# Patient Record
Sex: Female | Born: 1965 | Race: Asian | Hispanic: No | Marital: Married | Smoking: Never smoker
Health system: Southern US, Community
[De-identification: ages and names within clinical notes are randomized; demographics above are authoritative.]

## PROBLEM LIST (undated history)

## (undated) DIAGNOSIS — C50919 Malignant neoplasm of unspecified site of unspecified female breast: Secondary | ICD-10-CM

## (undated) HISTORY — PX: MASTECTOMY: SHX3

## (undated) HISTORY — DX: Malignant neoplasm of unspecified site of unspecified female breast: C50.919

---

## 2008-09-13 ENCOUNTER — Ambulatory Visit: Payer: Self-pay

## 2008-11-13 ENCOUNTER — Ambulatory Visit: Payer: Self-pay | Admitting: General Surgery

## 2008-11-16 ENCOUNTER — Ambulatory Visit: Payer: Self-pay | Admitting: General Surgery

## 2008-11-27 ENCOUNTER — Ambulatory Visit: Payer: Self-pay | Admitting: Oncology

## 2008-12-04 ENCOUNTER — Ambulatory Visit: Payer: Self-pay | Admitting: Oncology

## 2008-12-28 ENCOUNTER — Ambulatory Visit: Payer: Self-pay | Admitting: Oncology

## 2009-01-27 ENCOUNTER — Ambulatory Visit: Payer: Self-pay | Admitting: Oncology

## 2009-02-27 ENCOUNTER — Ambulatory Visit: Payer: Self-pay | Admitting: Oncology

## 2009-03-29 ENCOUNTER — Ambulatory Visit: Payer: Self-pay | Admitting: Oncology

## 2009-04-29 ENCOUNTER — Ambulatory Visit: Payer: Self-pay | Admitting: Oncology

## 2009-04-30 ENCOUNTER — Ambulatory Visit: Payer: Self-pay | Admitting: Oncology

## 2009-05-09 ENCOUNTER — Ambulatory Visit: Payer: Self-pay | Admitting: General Surgery

## 2009-05-10 ENCOUNTER — Inpatient Hospital Stay: Payer: Self-pay | Admitting: General Surgery

## 2009-05-30 ENCOUNTER — Ambulatory Visit: Payer: Self-pay | Admitting: Oncology

## 2009-06-29 ENCOUNTER — Ambulatory Visit: Payer: Self-pay | Admitting: Oncology

## 2009-07-30 ENCOUNTER — Ambulatory Visit: Payer: Self-pay | Admitting: Oncology

## 2009-08-29 ENCOUNTER — Ambulatory Visit: Payer: Self-pay | Admitting: Oncology

## 2009-09-29 ENCOUNTER — Ambulatory Visit: Payer: Self-pay | Admitting: Oncology

## 2009-10-16 IMAGING — NM NM  CARDIAC MUGA REST SCAN 2 0F 2
7 series · 27 of 27 positions shown · non-contrast
Comparison: none

REASON FOR EXAM: Breast CA High Risk Meds  Notified [facility]
COMMENTS:

[Series 1000: lao 45 · 6.59mm/px · 1 of 1 slices shown]
[im 1/1]
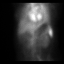

[Series 1000: lao 45-gated (results) · 6.59mm/px · 6 of 24 frames shown]
[frame 3/24]
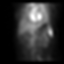
[frame 7/24]
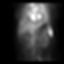
[frame 11/24]
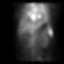
[frame 15/24]
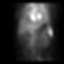
[frame 19/24]
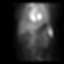
[frame 23/24]
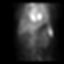

[Series 1000: lao 45-gated · 6.59mm/px · 6 of 24 frames shown]
[frame 3/24]
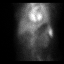
[frame 7/24]
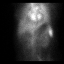
[frame 11/24]
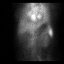
[frame 15/24]
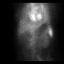
[frame 19/24]
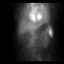
[frame 23/24]
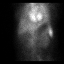

[Series 1000: ant · 6.59mm/px · 1 of 1 slices shown]
[im 1/1]
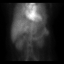

[Series 1000: lao 70-gated · 6.59mm/px · 6 of 24 frames shown]
[frame 3/24]
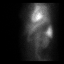
[frame 7/24]
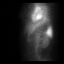
[frame 11/24]
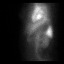
[frame 15/24]
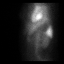
[frame 19/24]
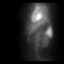
[frame 23/24]
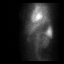

[Series 1000: ant-gated · 6.59mm/px · 6 of 24 frames shown]
[frame 3/24]
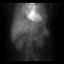
[frame 7/24]
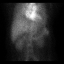
[frame 11/24]
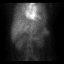
[frame 15/24]
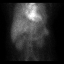
[frame 19/24]
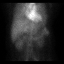
[frame 23/24]
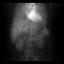

[Series 1000: lao 70 · 6.59mm/px · 1 of 1 slices shown]
[im 1/1]
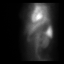

[27 of 27 positions shown; findings below may reference images not displayed]

PROCEDURE:     NM  - NM REST MUGA SCAN [DATE] OF [DATE]  - [DATE] [DATE] [DATE]  [DATE]

RESULT:     The patient's blood pool was labeled with 25.7 mCi of technetium
99m labeled pertechnetate with 3 mL of pyrophosphate. Review of the cine
images reveals normal left ventricular wall motion. The left ventricular
ejection fraction is normal at 61%.
IMPRESSION: Normal left ventricular ejection fraction of 61%.

## 2009-10-30 ENCOUNTER — Ambulatory Visit: Payer: Self-pay | Admitting: Oncology

## 2009-11-27 ENCOUNTER — Ambulatory Visit: Payer: Self-pay | Admitting: Oncology

## 2009-12-28 ENCOUNTER — Ambulatory Visit: Payer: Self-pay | Admitting: Oncology

## 2010-01-27 ENCOUNTER — Ambulatory Visit: Payer: Self-pay | Admitting: Oncology

## 2010-02-27 ENCOUNTER — Ambulatory Visit: Payer: Self-pay | Admitting: Oncology

## 2010-03-29 ENCOUNTER — Ambulatory Visit: Payer: Self-pay | Admitting: Oncology

## 2010-05-30 ENCOUNTER — Ambulatory Visit: Payer: Self-pay | Admitting: Oncology

## 2010-06-11 ENCOUNTER — Ambulatory Visit: Payer: Self-pay | Admitting: Oncology

## 2010-06-29 ENCOUNTER — Ambulatory Visit: Payer: Self-pay | Admitting: Oncology

## 2010-09-03 ENCOUNTER — Ambulatory Visit: Payer: Self-pay | Admitting: Oncology

## 2010-09-29 ENCOUNTER — Ambulatory Visit: Payer: Self-pay | Admitting: Oncology

## 2010-12-03 ENCOUNTER — Ambulatory Visit: Payer: Self-pay | Admitting: Oncology

## 2010-12-04 LAB — CANCER ANTIGEN 27.29: CA 27.29: 16.7 U/mL (ref 0.0–38.6)

## 2010-12-29 ENCOUNTER — Ambulatory Visit: Payer: Self-pay | Admitting: Oncology

## 2011-02-18 ENCOUNTER — Ambulatory Visit: Payer: Self-pay

## 2011-03-04 ENCOUNTER — Ambulatory Visit: Payer: Self-pay | Admitting: Oncology

## 2011-03-05 LAB — CANCER ANTIGEN 27.29: CA 27.29: 11.9 U/mL (ref 0.0–38.6)

## 2011-03-30 ENCOUNTER — Ambulatory Visit: Payer: Self-pay | Admitting: Oncology

## 2011-06-10 ENCOUNTER — Ambulatory Visit: Payer: Self-pay | Admitting: Oncology

## 2011-06-30 ENCOUNTER — Ambulatory Visit: Payer: Self-pay | Admitting: Oncology

## 2011-09-09 ENCOUNTER — Ambulatory Visit: Payer: Self-pay | Admitting: Oncology

## 2011-09-30 ENCOUNTER — Ambulatory Visit: Payer: Self-pay | Admitting: Oncology

## 2012-02-18 ENCOUNTER — Ambulatory Visit: Payer: Self-pay

## 2012-02-20 ENCOUNTER — Ambulatory Visit: Payer: Self-pay | Admitting: Oncology

## 2012-02-28 ENCOUNTER — Ambulatory Visit: Payer: Self-pay | Admitting: Oncology

## 2012-07-20 ENCOUNTER — Ambulatory Visit: Payer: Self-pay | Admitting: Oncology

## 2012-07-30 ENCOUNTER — Ambulatory Visit: Payer: Self-pay | Admitting: Oncology

## 2012-08-21 ENCOUNTER — Emergency Department: Payer: Self-pay | Admitting: Emergency Medicine

## 2012-08-21 LAB — URINALYSIS, COMPLETE
Glucose,UR: NEGATIVE mg/dL (ref 0–75)
Ketone: NEGATIVE
Ph: 6 (ref 4.5–8.0)
Protein: 30
RBC,UR: 41 /HPF (ref 0–5)
Specific Gravity: 1.027 (ref 1.003–1.030)
Squamous Epithelial: 10

## 2012-08-21 LAB — CBC
MCH: 31.1 pg (ref 26.0–34.0)
MCHC: 35.6 g/dL (ref 32.0–36.0)
Platelet: 233 10*3/uL (ref 150–440)
RBC: 4.55 10*6/uL (ref 3.80–5.20)
WBC: 6.5 10*3/uL (ref 3.6–11.0)

## 2012-08-21 LAB — PREGNANCY, URINE: Pregnancy Test, Urine: NEGATIVE m[IU]/mL

## 2012-08-21 LAB — COMPREHENSIVE METABOLIC PANEL
Albumin: 3.2 g/dL — ABNORMAL LOW (ref 3.4–5.0)
Alkaline Phosphatase: 81 U/L (ref 50–136)
BUN: 15 mg/dL (ref 7–18)
Chloride: 106 mmol/L (ref 98–107)
EGFR (African American): >60
EGFR (Non-African Amer.): >60
Glucose: 195 mg/dL — ABNORMAL HIGH (ref 65–99)
SGOT(AST): 26 U/L (ref 15–37)
SGPT (ALT): 22 U/L (ref 12–78)
Total Protein: 8.3 g/dL — ABNORMAL HIGH (ref 6.4–8.2)

## 2013-01-18 ENCOUNTER — Ambulatory Visit: Payer: Self-pay | Admitting: Oncology

## 2013-01-19 LAB — CANCER ANTIGEN 27.29: CA 27.29: 17.3 U/mL (ref 0.0–38.6)

## 2013-01-27 ENCOUNTER — Ambulatory Visit: Payer: Self-pay | Admitting: Oncology

## 2013-02-28 ENCOUNTER — Ambulatory Visit: Payer: Self-pay | Admitting: Oncology

## 2013-07-25 ENCOUNTER — Ambulatory Visit: Payer: Self-pay | Admitting: Oncology

## 2013-07-30 ENCOUNTER — Ambulatory Visit: Payer: Self-pay | Admitting: Oncology

## 2014-01-26 ENCOUNTER — Ambulatory Visit: Payer: Self-pay | Admitting: Oncology

## 2014-01-27 ENCOUNTER — Ambulatory Visit: Payer: Self-pay | Admitting: Oncology

## 2014-01-27 LAB — CANCER ANTIGEN 27.29: CA 27.29: 26.2 U/mL (ref 0.0–38.6)

## 2014-03-06 ENCOUNTER — Ambulatory Visit: Payer: Self-pay | Admitting: Oncology

## 2014-07-31 ENCOUNTER — Ambulatory Visit: Payer: Self-pay | Admitting: Oncology

## 2014-08-01 LAB — CANCER ANTIGEN 27.29: CA 27.29: 31.6 U/mL (ref 0.0–38.6)

## 2014-08-29 ENCOUNTER — Ambulatory Visit: Payer: Self-pay | Admitting: Oncology

## 2014-12-20 ENCOUNTER — Emergency Department: Payer: Self-pay | Admitting: Emergency Medicine

## 2015-01-08 ENCOUNTER — Ambulatory Visit
Admit: 2015-01-08 | Disposition: A | Discharge status: Home / Self Care | Payer: Self-pay | Attending: Oncology | Admitting: Oncology

## 2015-01-09 LAB — CANCER ANTIGEN 27.29: CA 27.29: 57.8 U/mL — AB (ref 0.0–38.6)

## 2015-01-12 ENCOUNTER — Other Ambulatory Visit: Payer: Self-pay | Admitting: Oncology

## 2015-01-12 DIAGNOSIS — R911 Solitary pulmonary nodule: Secondary | ICD-10-CM

## 2015-04-05 ENCOUNTER — Encounter: Payer: Self-pay | Admitting: Oncology

## 2015-04-05 ENCOUNTER — Ambulatory Visit: Payer: Self-pay

## 2015-04-05 ENCOUNTER — Inpatient Hospital Stay: Payer: Self-pay | Attending: Oncology | Admitting: Oncology

## 2015-04-05 VITALS — BP 136/98 | HR 111 | Temp 99.5°F | Resp 28 | Wt 131.8 lb

## 2015-04-05 DIAGNOSIS — R05 Cough: Secondary | ICD-10-CM | POA: Insufficient documentation

## 2015-04-05 DIAGNOSIS — C50919 Malignant neoplasm of unspecified site of unspecified female breast: Secondary | ICD-10-CM | POA: Insufficient documentation

## 2015-04-05 DIAGNOSIS — R918 Other nonspecific abnormal finding of lung field: Secondary | ICD-10-CM | POA: Insufficient documentation

## 2015-04-05 DIAGNOSIS — R634 Abnormal weight loss: Secondary | ICD-10-CM | POA: Insufficient documentation

## 2015-04-05 DIAGNOSIS — R0602 Shortness of breath: Secondary | ICD-10-CM | POA: Insufficient documentation

## 2015-04-05 LAB — CBC WITH DIFFERENTIAL/PLATELET
BASOS PCT: 1 %
Basophils Absolute: 0 10*3/uL (ref 0–0.1)
EOS ABS: 0 10*3/uL (ref 0–0.7)
EOS PCT: 0 %
HEMATOCRIT: 37.2 % (ref 35.0–47.0)
HEMOGLOBIN: 12.1 g/dL (ref 12.0–16.0)
Lymphocytes Relative: 15 %
Lymphs Abs: 1.6 10*3/uL (ref 1.0–3.6)
MCH: 26.9 pg (ref 26.0–34.0)
MCHC: 32.5 g/dL (ref 32.0–36.0)
MCV: 82.8 fL (ref 80.0–100.0)
MONOS PCT: 6 %
Monocytes Absolute: 0.6 10*3/uL (ref 0.2–0.9)
Neutro Abs: 8.5 10*3/uL — ABNORMAL HIGH (ref 1.4–6.5)
Neutrophils Relative %: 78 %
Platelets: 329 10*3/uL (ref 150–440)
RBC: 4.49 MIL/uL (ref 3.80–5.20)
RDW: 13.9 % (ref 11.5–14.5)
WBC: 10.9 10*3/uL (ref 3.6–11.0)

## 2015-04-05 LAB — COMPREHENSIVE METABOLIC PANEL
ALBUMIN: 3.5 g/dL (ref 3.5–5.0)
ALK PHOS: 97 U/L (ref 38–126)
ALT: 10 U/L — ABNORMAL LOW (ref 14–54)
AST: 17 U/L (ref 15–41)
Anion gap: 8 (ref 5–15)
BILIRUBIN TOTAL: 0.7 mg/dL (ref 0.3–1.2)
BUN: 9 mg/dL (ref 6–20)
CHLORIDE: 104 mmol/L (ref 101–111)
CO2: 21 mmol/L — ABNORMAL LOW (ref 22–32)
Calcium: 8.3 mg/dL — ABNORMAL LOW (ref 8.9–10.3)
Creatinine, Ser: 0.56 mg/dL (ref 0.44–1.00)
GFR calc Af Amer: >60 mL/min (ref >60)
GFR calc non Af Amer: >60 mL/min (ref >60)
Glucose, Bld: 105 mg/dL — ABNORMAL HIGH (ref 65–99)
Potassium: 3.2 mmol/L — ABNORMAL LOW (ref 3.5–5.1)
Sodium: 133 mmol/L — ABNORMAL LOW (ref 135–145)
Total Protein: 8.3 g/dL — ABNORMAL HIGH (ref 6.5–8.1)

## 2015-04-05 MED ORDER — ALPRAZOLAM 0.25 MG PO TABS: 0.2500 mg | ORAL_TABLET | Freq: Three times a day (TID) | ORAL | Status: AC | PRN

## 2015-04-05 MED ORDER — BENZONATATE 100 MG PO CAPS: 100.0000 mg | ORAL_CAPSULE | Freq: Three times a day (TID) | ORAL | Status: AC | PRN

## 2015-04-06 LAB — CANCER ANTIGEN 27.29: CA 27.29: 123.1 U/mL — ABNORMAL HIGH (ref 0.0–38.6)

## 2015-04-10 ENCOUNTER — Telehealth: Payer: Self-pay

## 2015-04-10 NOTE — Telephone Encounter (Signed)
Per pt family cancel appt. For CT Scan pt went home.

## 2015-04-13 ENCOUNTER — Ambulatory Visit: Payer: Self-pay

## 2015-04-20 NOTE — Progress Notes (Signed)
[facility]  [phone] [phone]) [phone]  ID: Michaela Gates OB: January 24, 1966  MR#: [id number]  ZMO#:[id number]  Patient Care Team: No Pcp Per Patient as PCP - General (General Practice)  CHIEF COMPLAINT:  Chief Complaint  Patient presents with  . Fatigue  . Cancer  . Shortness of Breath    INTERVAL HISTORY: Patient returns to clinic today for further evaluation. Her performance status has significantly declined since April. She has worsening shortness of breath, cough, and weight loss. She no longer is able to work. She is asking if it is safe to fly home to [place]. Patient feels generally terrible, but offers no further specific complaints.  REVIEW OF SYSTEMS:   Review of Systems  Constitutional: Positive for weight loss and malaise/fatigue. Negative for fever.  Respiratory: Positive for cough and shortness of breath.   Cardiovascular: Positive for chest pain.  Gastrointestinal: Negative.   Neurological: Positive for weakness.    As per HPI. Otherwise, a complete review of systems is negatve.  PAST MEDICAL HISTORY: Past Medical History  Diagnosis Date  . Breast cancer     PAST SURGICAL HISTORY: Past Surgical History  Procedure Laterality Date  . Mastectomy Left     FAMILY HISTORY History reviewed. No pertinent family history.     ADVANCED DIRECTIVES:    HEALTH MAINTENANCE: History  Substance Use Topics  . Smoking status: Never Smoker   . Smokeless tobacco: Never Used  . Alcohol Use: Not on file     Colonoscopy:  PAP:  Bone density:  Lipid panel:  Allergies  Allergen Reactions  . No Known Allergies Other (See Comments)    none    Current Outpatient Prescriptions  Medication Sig Dispense Refill  . ALPRAZolam (XANAX) 0.25 MG tablet Take 1 tablet (0.25 mg total) by mouth 3 (three) times daily as needed for anxiety. 30 tablet 0  . benzonatate (TESSALON) 100 MG capsule Take 1 capsule (100 mg total) by mouth 3 (three)  times daily as needed for cough. 90 capsule 1   No current facility-administered medications for this visit.    OBJECTIVE: Filed Vitals:   04/05/15 1018  BP: 136/98  Pulse: 111  Temp: 99.5 F (37.5 C)  Resp: 28     There is no height on file to calculate BMI.    ECOG FS:3 - Symptomatic, >50% confined to bed  General: Ill-appearing, mild distress. Eyes: anicteric sclera. Lungs: Clear to auscultation bilaterally. Heart: Regular rate and rhythm. No rubs, murmurs, or gallops. Abdomen: Soft, nontender, nondistended. No organomegaly noted, normoactive bowel sounds. Musculoskeletal: No edema, cyanosis, or clubbing. Neuro: Alert, answering all questions appropriately. Cranial nerves grossly intact. Skin: No rashes or petechiae noted. Psych: Normal affect.   LAB RESULTS:  Lab Results  Component Value Date   NA 133* 04/05/2015   K 3.2* 04/05/2015   CL 104 04/05/2015   CO2 21* 04/05/2015   GLUCOSE 105* 04/05/2015   BUN 9 04/05/2015   CREATININE 0.56 04/05/2015   CALCIUM 8.3* 04/05/2015   PROT 8.3* 04/05/2015   ALBUMIN 3.5 04/05/2015   AST 17 04/05/2015   ALT 10* 04/05/2015   ALKPHOS 97 04/05/2015   BILITOT 0.7 04/05/2015   GFRNONAA >60 04/05/2015   GFRAA >60 04/05/2015    Lab Results  Component Value Date   WBC 10.9 04/05/2015   NEUTROABS 8.5* 04/05/2015   HGB 12.1 04/05/2015   HCT 37.2 04/05/2015   MCV 82.8 04/05/2015   PLT 329 04/05/2015     STUDIES:  No results found.  ASSESSMENT: Likely progressive metastatic breast cancer.  PLAN:    1. Breast cancer: CT scan results from March previously reviewed with a rapidly enlarging lung mass that was highly suspicious for recurrent breast cancer. CA-27-29 is also trending up. Patient refused biopsy or treatment at that time. It now appears that she is actively dying from progressive disease. She continues to refuse any further imaging or treatment. Have recommended hospice care, the patient refuses. Have also  recommended that it may be unsafe for her to fly 18 hours to [place]. Patient once again requested no further follow-up.  Approximately 30 minutes was spent in discussion and consultation.   Lloyd Huger, MD   04/20/2015 5:19 PM

## 2015-05-07 LAB — COMPREHENSIVE METABOLIC PANEL
ALBUMIN: 4.5 g/dL
ALT: 9 U/L — AB
ANION GAP: 7 (ref 7–16)
Alkaline Phosphatase: 112 U/L
BUN: 16 mg/dL
Bilirubin,Total: 0.4 mg/dL
Calcium, Total: 9.3 mg/dL
Chloride: 107 mmol/L
Co2: 25 mmol/L
Creatinine: 0.54 mg/dL
EGFR (African American): >60
EGFR (Non-African Amer.): >60
Glucose: 106 mg/dL — ABNORMAL HIGH
Potassium: 3.5 mmol/L
SGOT(AST): 17 U/L
Sodium: 139 mmol/L
Total Protein: 9.1 g/dL — ABNORMAL HIGH

## 2015-05-07 LAB — CBC WITH DIFFERENTIAL/PLATELET
Basophil #: 0 10*3/uL (ref 0.0–0.1)
Basophil %: 0.3 %
EOS PCT: 1.7 %
Eosinophil #: 0.2 10*3/uL (ref 0.0–0.7)
HCT: 42.7 % (ref 35.0–47.0)
HGB: 14.4 g/dL (ref 12.0–16.0)
LYMPHS ABS: 2.2 10*3/uL (ref 1.0–3.6)
LYMPHS PCT: 21.6 %
MCH: 29.7 pg (ref 26.0–34.0)
MCHC: 33.8 g/dL (ref 32.0–36.0)
MCV: 88 fL (ref 80–100)
MONOS PCT: 6 %
Monocyte #: 0.6 x10 3/mm (ref 0.2–0.9)
Neutrophil #: 7.1 10*3/uL — ABNORMAL HIGH (ref 1.4–6.5)
Neutrophil %: 70.4 %
Platelet: 290 10*3/uL (ref 150–440)
RBC: 4.86 10*6/uL (ref 3.80–5.20)
RDW: 12.7 % (ref 11.5–14.5)
WBC: 10.1 10*3/uL (ref 3.6–11.0)

## 2015-05-31 DEATH — deceased

## 2015-07-02 ENCOUNTER — Ambulatory Visit: Payer: Self-pay

## 2015-07-09 ENCOUNTER — Other Ambulatory Visit: Payer: Self-pay

## 2015-07-09 ENCOUNTER — Ambulatory Visit: Payer: Self-pay | Admitting: Oncology
